# Patient Record
Sex: Male | Born: 1955 | Race: White | Hispanic: No | Marital: Married | Smoking: Current every day smoker
Health system: Southern US, Community
[De-identification: ages and names within clinical notes are randomized; demographics above are authoritative.]

## PROBLEM LIST (undated history)

## (undated) DIAGNOSIS — R1310 Dysphagia, unspecified: Secondary | ICD-10-CM

## (undated) DIAGNOSIS — D649 Anemia, unspecified: Secondary | ICD-10-CM

## (undated) DIAGNOSIS — Z932 Ileostomy status: Secondary | ICD-10-CM

## (undated) DIAGNOSIS — K56609 Unspecified intestinal obstruction, unspecified as to partial versus complete obstruction: Secondary | ICD-10-CM

## (undated) HISTORY — PX: APPENDECTOMY: SHX54

---

## 2002-08-17 ENCOUNTER — Ambulatory Visit (HOSPITAL_BASED_OUTPATIENT_CLINIC_OR_DEPARTMENT_OTHER): Admission: RE | Admit: 2002-08-17 | Discharge: 2002-08-17 | Payer: Self-pay | Admitting: Orthopedic Surgery

## 2009-07-31 ENCOUNTER — Ambulatory Visit: Payer: Self-pay | Admitting: Diagnostic Radiology

## 2009-07-31 ENCOUNTER — Emergency Department (HOSPITAL_BASED_OUTPATIENT_CLINIC_OR_DEPARTMENT_OTHER): Admission: EM | Admit: 2009-07-31 | Discharge: 2009-07-31 | Payer: Self-pay | Admitting: Emergency Medicine

## 2009-09-28 ENCOUNTER — Ambulatory Visit: Payer: Self-pay | Admitting: Diagnostic Radiology

## 2009-09-28 ENCOUNTER — Emergency Department (HOSPITAL_BASED_OUTPATIENT_CLINIC_OR_DEPARTMENT_OTHER): Admission: EM | Admit: 2009-09-28 | Discharge: 2009-09-28 | Payer: Self-pay | Admitting: Emergency Medicine

## 2010-06-29 ENCOUNTER — Emergency Department (INDEPENDENT_AMBULATORY_CARE_PROVIDER_SITE_OTHER): Payer: Self-pay

## 2010-06-29 ENCOUNTER — Emergency Department (HOSPITAL_BASED_OUTPATIENT_CLINIC_OR_DEPARTMENT_OTHER)
Admission: EM | Admit: 2010-06-29 | Discharge: 2010-06-29 | Disposition: A | Discharge status: Home / Self Care | Payer: Self-pay | Attending: Emergency Medicine | Admitting: Emergency Medicine

## 2010-06-29 DIAGNOSIS — R079 Chest pain, unspecified: Secondary | ICD-10-CM

## 2010-06-29 DIAGNOSIS — M25519 Pain in unspecified shoulder: Secondary | ICD-10-CM | POA: Insufficient documentation

## 2010-06-29 DIAGNOSIS — R109 Unspecified abdominal pain: Secondary | ICD-10-CM | POA: Insufficient documentation

## 2010-06-29 DIAGNOSIS — W11XXXA Fall on and from ladder, initial encounter: Secondary | ICD-10-CM | POA: Insufficient documentation

## 2010-06-29 DIAGNOSIS — S20229A Contusion of unspecified back wall of thorax, initial encounter: Secondary | ICD-10-CM | POA: Insufficient documentation

## 2010-07-08 LAB — CBC
HCT: 49.4 % (ref 39.0–52.0)
Hemoglobin: 16.3 g/dL (ref 13.0–17.0)
MCV: 87.6 fL (ref 78.0–100.0)
Platelets: 216 10*3/uL (ref 150–400)

## 2010-07-08 LAB — URINALYSIS, ROUTINE W REFLEX MICROSCOPIC
Protein, ur: NEGATIVE mg/dL
Specific Gravity, Urine: 1.01 (ref 1.005–1.030)

## 2010-07-08 LAB — BASIC METABOLIC PANEL
CO2: 26 mEq/L (ref 19–32)
Calcium: 9.9 mg/dL (ref 8.4–10.5)
Chloride: 106 mEq/L (ref 96–112)
Sodium: 142 mEq/L (ref 135–145)

## 2010-07-08 LAB — DIFFERENTIAL
Basophils Relative: 2 % — ABNORMAL HIGH (ref 0–1)
Eosinophils Absolute: 0.1 10*3/uL (ref 0.0–0.7)
Lymphocytes Relative: 29 % (ref 12–46)
Lymphs Abs: 2 10*3/uL (ref 0.7–4.0)
Monocytes Absolute: 0.5 10*3/uL (ref 0.1–1.0)
Monocytes Relative: 7 % (ref 3–12)
Neutrophils Relative %: 60 % (ref 43–77)

## 2010-09-04 NOTE — Op Note (Signed)
NAME:  Devon Campbell, Devon Campbell                           ACCOUNT NO.:  [id number]   MEDICAL RECORD NO.:  [id number]                   PATIENT TYPE:  AMB   LOCATION:  DSC                                  FACILITY:  [facility]   PHYSICIAN:  Harvie Junior, M.D.                DATE OF BIRTH:  04/12/1956   DATE OF PROCEDURE:  08/17/2002  DATE OF DISCHARGE:                                 OPERATIVE REPORT   PREOPERATIVE DIAGNOSIS:  Dislocated metacarpophalangeal joint, little finger  right hand, irreducible.   POSTOPERATIVE DIAGNOSIS:  Dislocated metacarpophalangeal joint, little  finger right hand, irreducible.   PROCEDURE:  Open reduction of right fifth metacarpal metacarpophalangeal  joint.   SURGEON:  Harvie Junior, M.D.   ASSISTANT:  Marshia Ly, P.A.   ANESTHESIA:  General.   BRIEF HISTORY:  He is a 55 year old male with a long history of having an  injury while working on a motorcycle.  He ultimately was evaluated initially  at urgent care and then ultimately by Otho Darner, M.D., at [facility], where he was noted to have a metacarpophalangeal joint  dislocation.  Ultimately multiple attempts at relocation were unsuccessful.  At that point we were consulted for management.  At that point we recognized  that he had an irreducible metacarpophalangeal joint and felt that open  reduction and internal fixation was the only appropriate course of action an  he was brought to the operating room for this procedure.   DESCRIPTION OF PROCEDURE:  The patient was brought to the operating room and  after adequate anesthesia was obtained with a general anesthetic, the  patient was placed on the operating table.  The right arm was prepped and  draped in the usual sterile fashion.  Following this an attempt at closed  reduction was again undertaken and was unsuccessful.  At this point  a  linear incision was made dorsally, subcutaneous tissue taken down to the  level of the  extensor mechanism, which was retracted to the side.  The  extensor hood was identified and divided.  The joint capsule was identified  and divided.  At this point the reason that the metacarpal would not reduce  was clearly identified, as it had buttonholed through the volar restraints.  The volar plate, which was trapped in the joint, was identified and at this  point we were able to use a Therapist, nutritional and actually lever the metacarpal  head around this irreducible situation so we did not have to cut it, which I  think it was good because it would not then allowed him to have a volar  tendency toward subluxation.  At this point, the metacarpophalangeal joint  reduced, was irrigated.  The joint was irrigated.  There was no evidence of  fracture or damage to the tissue, although there were some small flakes of  cartilage that were removed from  the joint at this point.  At this point the  extensor mechanism, the periosteum over the metacarpal was reduced and  repaired with a 4-0 Vicryl interrupted suture. The extensor hood was then  repaired with a 4-0 Vicryl interrupted suture.  The skin was then closed  with a 4-0 nylon.  Multiple fluoroscopic images were used throughout the  case to make sure that the joint did stay reduced.  There was no  tendency toward re-dislocation.  At this point the patient was placed into  an ulnar gutter splint after the skin was closed with 4-0 nylon and a  sterile compressive dressing had been applied.  The patient was taken to the  operating room, where he was noted to be in satisfactory condition.  Estimated blood loss for the procedure was none.                                               Harvie Junior, M.D.    Ranae Plumber  D:  08/17/2002  T:  08/17/2002  Job:  [id number]

## 2013-11-13 ENCOUNTER — Encounter (HOSPITAL_BASED_OUTPATIENT_CLINIC_OR_DEPARTMENT_OTHER): Payer: Self-pay | Admitting: Emergency Medicine

## 2013-11-13 ENCOUNTER — Emergency Department (HOSPITAL_BASED_OUTPATIENT_CLINIC_OR_DEPARTMENT_OTHER)
Admission: EM | Admit: 2013-11-13 | Discharge: 2013-11-13 | Disposition: A | Discharge status: Home / Self Care | Payer: Self-pay | Attending: Emergency Medicine | Admitting: Emergency Medicine

## 2013-11-13 DIAGNOSIS — W268XXA Contact with other sharp object(s), not elsewhere classified, initial encounter: Secondary | ICD-10-CM | POA: Insufficient documentation

## 2013-11-13 DIAGNOSIS — Y929 Unspecified place or not applicable: Secondary | ICD-10-CM | POA: Insufficient documentation

## 2013-11-13 DIAGNOSIS — S51809A Unspecified open wound of unspecified forearm, initial encounter: Secondary | ICD-10-CM | POA: Insufficient documentation

## 2013-11-13 DIAGNOSIS — F17200 Nicotine dependence, unspecified, uncomplicated: Secondary | ICD-10-CM | POA: Insufficient documentation

## 2013-11-13 DIAGNOSIS — Y939 Activity, unspecified: Secondary | ICD-10-CM | POA: Insufficient documentation

## 2013-11-13 DIAGNOSIS — S51811A Laceration without foreign body of right forearm, initial encounter: Secondary | ICD-10-CM

## 2013-11-13 MED ORDER — HYDROCODONE-ACETAMINOPHEN 5-325 MG PO TABS: 2.0000 | ORAL_TABLET | ORAL | Status: DC | PRN

## 2013-11-13 NOTE — ED Notes (Signed)
Laceration to right forearm 5 cm. Bleeding controlled. Partial thickness

## 2013-11-13 NOTE — Discharge Instructions (Signed)
Sutured Wound Care °Sutures are stitches that can be used to close wounds. Wound care helps prevent pain and infection.  °HOME CARE INSTRUCTIONS  °· Rest and elevate the injured area until all the pain and swelling are gone. °· Only take over-the-counter or prescription medicines for pain, discomfort, or fever as directed by your caregiver. °· After 48 hours, gently wash the area with mild soap and water once a day, or as directed. Rinse off the soap. Pat the area dry with a clean towel. Do not rub the wound. This may cause bleeding. °· Follow your caregiver's instructions for how often to change the bandage (dressing). Stop using a dressing after 2 days or after the wound stops draining. °· If the dressing sticks, moisten it with soapy water and gently remove it. °· Apply ointment on the wound as directed. °· Avoid stretching a sutured wound. °· Drink enough fluids to keep your urine clear or pale yellow. °· Follow up with your caregiver for suture removal as directed. °· Use sunscreen on your wound for the next 3 to 6 months so the scar will not darken. °SEEK IMMEDIATE MEDICAL CARE IF:  °· Your wound becomes red, swollen, hot, or tender. °· You have increasing pain in the wound. °· You have a red streak that extends from the wound. °· There is pus coming from the wound. °· You have a fever. °· You have shaking chills. °· There is a bad smell coming from the wound. °· You have persistent bleeding from the wound. °MAKE SURE YOU:  °· Understand these instructions. °· Will watch your condition. °· Will get help right away if you are not doing well or get worse. °Document Released: 05/13/2004 Document Revised: 06/28/2011 Document Reviewed: 08/09/2010 °ExitCare® Patient Information ©2015 [employer]. This information is not intended to replace advice given to you by your health care provider. Make sure you discuss any questions you have with your health care provider. ° °

## 2013-11-13 NOTE — ED Provider Notes (Signed)
CSN: [id number]     Arrival date & time 11/13/13  1349 History   First MD Initiated Contact with Patient 11/13/13 1433     Chief Complaint  Patient presents with  . Extremity Laceration     (Consider location/radiation/quality/duration/timing/severity/associated sxs/prior Treatment) Patient is a 58 y.o. male presenting with skin laceration. The history is provided by the patient. No language interpreter was used.  Laceration Location:  Shoulder/arm Shoulder/arm laceration location:  R forearm Length (cm):  3 Depth:  Cutaneous Time since incident:  1 hour Laceration mechanism:  Metal edge Pain details:    Quality:  Aching   Severity:  No pain Foreign body present:  No foreign bodies Relieved by:  Nothing Worsened by:  Nothing tried Tetanus status:  Up to date   History reviewed. No pertinent past medical history. Past Surgical History  Procedure Laterality Date  . Appendectomy     No family history on file. History  Substance Use Topics  . Smoking status: Current Every Day Smoker  . Smokeless tobacco: Not on file  . Alcohol Use: Yes    Review of Systems  Skin: Positive for wound.  All other systems reviewed and are negative.     Allergies  Review of patient's allergies indicates no known allergies.  Home Medications   Prior to Admission medications   Not on File   BP 129/83  Pulse 98  Temp(Src) 97.4 F (36.3 C) (Oral)  Resp 18  Ht 6\' 2"  (1.88 m)  Wt 230 lb (104.327 kg)  BMI 29.52 kg/m2  SpO2 97% Physical Exam  Constitutional: He appears well-developed and well-nourished.  HENT:  Head: Normocephalic and atraumatic.  Eyes: Pupils are equal, round, and reactive to light.  Cardiovascular: Normal rate.   Pulmonary/Chest: Effort normal.  Musculoskeletal: He exhibits tenderness.  3 cm laceration right forearm  Neurological: He is alert.  Skin: Skin is warm.  Psychiatric: He has a normal mood and affect.    ED Course  LACERATION REPAIR Date/Time:  11/13/2013 3:40 PM Performed by: Elson AreasSOFIA, LESLIE K Authorized by: Elson AreasSOFIA, LESLIE K Consent: Verbal consent obtained. Risks and benefits: risks, benefits and alternatives were discussed Consent given by: patient Patient understanding: patient states understanding of the procedure being performed Patient identity confirmed: verbally with patient Laceration length: 3 cm Foreign bodies: no foreign bodies Tendon involvement: none Nerve involvement: none Anesthesia: local infiltration Local anesthetic: lidocaine 1% without epinephrine Patient sedated: no Preparation: Patient was prepped and draped in the usual sterile fashion. Skin closure: 5-0 Prolene Number of sutures: 5 Technique: simple Approximation: loose Approximation difficulty: simple Patient tolerance: Patient tolerated the procedure well with no immediate complications.   (including critical care time) Labs Review Labs Reviewed - No data to display  Imaging Review No results found.   EKG Interpretation None      MDM   Final diagnoses:  Laceration of forearm, right, initial encounter    Hydrocodone for pain Suture removal in 8 days    Elson AreasLeslie K Sofia, PA-C 11/13/13 1541

## 2013-11-15 NOTE — ED Provider Notes (Signed)
Medical screening examination/treatment/procedure(s) were performed by non-physician practitioner and as supervising physician I was immediately available for consultation/collaboration.   EKG Interpretation None       Martha K Linker, MD 11/15/13 0653 

## 2013-11-24 ENCOUNTER — Encounter (HOSPITAL_BASED_OUTPATIENT_CLINIC_OR_DEPARTMENT_OTHER): Payer: Self-pay | Admitting: Emergency Medicine

## 2013-11-24 ENCOUNTER — Emergency Department (HOSPITAL_BASED_OUTPATIENT_CLINIC_OR_DEPARTMENT_OTHER)
Admission: EM | Admit: 2013-11-24 | Discharge: 2013-11-24 | Disposition: A | Discharge status: Home / Self Care | Payer: Self-pay | Attending: Emergency Medicine | Admitting: Emergency Medicine

## 2013-11-24 DIAGNOSIS — R509 Fever, unspecified: Secondary | ICD-10-CM | POA: Insufficient documentation

## 2013-11-24 DIAGNOSIS — T8140XA Infection following a procedure, unspecified, initial encounter: Secondary | ICD-10-CM | POA: Insufficient documentation

## 2013-11-24 DIAGNOSIS — T798XXA Other early complications of trauma, initial encounter: Secondary | ICD-10-CM

## 2013-11-24 DIAGNOSIS — S41111S Laceration without foreign body of right upper arm, sequela: Secondary | ICD-10-CM

## 2013-11-24 DIAGNOSIS — Z4801 Encounter for change or removal of surgical wound dressing: Secondary | ICD-10-CM | POA: Insufficient documentation

## 2013-11-24 DIAGNOSIS — F17200 Nicotine dependence, unspecified, uncomplicated: Secondary | ICD-10-CM | POA: Insufficient documentation

## 2013-11-24 DIAGNOSIS — Y838 Other surgical procedures as the cause of abnormal reaction of the patient, or of later complication, without mention of misadventure at the time of the procedure: Secondary | ICD-10-CM | POA: Insufficient documentation

## 2013-11-24 MED ORDER — OXYCODONE-ACETAMINOPHEN 5-325 MG PO TABS
1.0000 | ORAL_TABLET | Freq: Once | ORAL | Status: AC
  Administered 2013-11-24: 1 via ORAL
  Filled 2013-11-24: qty 1

## 2013-11-24 MED ORDER — CLINDAMYCIN HCL 150 MG PO CAPS: 450.0000 mg | ORAL_CAPSULE | Freq: Three times a day (TID) | ORAL | Status: DC

## 2013-11-24 MED ORDER — CLINDAMYCIN HCL 150 MG PO CAPS
450.0000 mg | ORAL_CAPSULE | Freq: Once | ORAL | Status: AC
  Administered 2013-11-24: 450 mg via ORAL
  Filled 2013-11-24: qty 3

## 2013-11-24 MED ORDER — OXYCODONE-ACETAMINOPHEN 5-325 MG PO TABS: 1.0000 | ORAL_TABLET | Freq: Four times a day (QID) | ORAL | Status: DC | PRN

## 2013-11-24 NOTE — Discharge Instructions (Signed)
Wound Infection °A wound infection happens when a type of germ (bacteria) starts growing in the wound. In some cases, this can cause the wound to break open. If cared for properly, the infected wound will heal from the inside to the outside. Wound infections need treatment. °CAUSES °An infection is caused by bacteria growing in the wound.  °SYMPTOMS  °· Increase in redness, swelling, or pain at the wound site. °· Increase in drainage at the wound site. °· Wound or bandage (dressing) starts to smell bad. °· Fever. °· Feeling tired or fatigued. °· Pus draining from the wound. °TREATMENT  °Your health care provider will prescribe antibiotic medicine. The wound infection should improve within 24 to 48 hours. Any redness around the wound should stop spreading and the wound should be less painful.  °HOME CARE INSTRUCTIONS  °· Only take over-the-counter or prescription medicines for pain, discomfort, or fever as directed by your health care provider. °· Take your antibiotics as directed. Finish them even if you start to feel better. °· Gently wash the area with mild soap and water 2 times a day, or as directed. Rinse off the soap. Pat the area dry with a clean towel. Do not rub the wound. This may cause bleeding. °· Follow your health care provider's instructions for how often you need to change the dressing. °· Apply ointment and a dressing to the wound as directed. °· If the dressing sticks, moisten it with soapy water and gently remove it. °· Change the bandage right away if it becomes wet, dirty, or develops a bad smell. °· Take showers. Do not take tub baths, swim, or do anything that may soak the wound until it is healed. °· Avoid exercises that make you sweat heavily. °· Use anti-itch medicine as directed by your health care provider. The wound may itch when it is healing. Do not pick or scratch at the wound. °· Follow up with your health care provider to get your wound rechecked as directed. °SEEK MEDICAL CARE  IF: °· You have an increase in swelling, pain, or redness around the wound. °· You have an increase in the amount of pus coming from the wound. °· There is a bad smell coming from the wound. °· More of the wound breaks open. °· You have a fever. °MAKE SURE YOU:  °· Understand these instructions. °· Will watch your condition. °· Will get help right away if you are not doing well or get worse. °Document Released: 01/02/2003 Document Revised: 04/10/2013 Document Reviewed: 08/09/2010 °ExitCare® Patient Information ©2015 [employer]. This information is not intended to replace advice given to you by your health care provider. Make sure you discuss any questions you have with your health care provider. ° °

## 2013-11-24 NOTE — ED Notes (Signed)
Patient had stitches placed last week on his right forearm, states he needs them out, but also feels the area may be infected. Area is warm and sensitive to touch.

## 2013-11-24 NOTE — ED Notes (Signed)
Stitches were removed and Bacitracin applied to wound and wrapped with gauze.

## 2013-11-24 NOTE — ED Provider Notes (Signed)
CSN: [id number]     Arrival date & time 11/24/13  1508 History  This chart was scribed for Toy Cookey, MD by Elon Spanner, ED Scribe. This patient was seen in room MH03/MH03 and the patient's care was started at 4:30 PM.    Chief Complaint  Patient presents with  . Wound Check   Patient is a 58 y.o. male presenting with wound check. The history is provided by the patient and the spouse. No language interpreter was used.  Wound Check This is a new problem. The current episode started more than 1 week ago. The problem has been gradually worsening (healing well.  minor drainage observed over last 3 days. ). Pertinent negatives include no chest pain, no abdominal pain, no headaches and no shortness of breath. Nothing aggravates the symptoms. Nothing relieves the symptoms.    HPI Comments: Devon Campbell is a 58 y.o. male who presents to the Emergency Department seeking suture removal for sutures placed on 7/27.  Patient states that the wound has been draining a greenish/yellow pus for 3 days and describes pain in the surrounding area.  Patients states that he had a TMAX of101.2 for the last 2 days.  Patient was given Bactrim to treat the sutured area and has been compliant.  Patient has also taken Tylenol today.     History reviewed. No pertinent past medical history. Past Surgical History  Procedure Laterality Date  . Appendectomy     No family history on file. History  Substance Use Topics  . Smoking status: Current Every Day Smoker  . Smokeless tobacco: Not on file  . Alcohol Use: Yes    Review of Systems  Constitutional: Positive for fever. Negative for activity change, appetite change and fatigue.  HENT: Negative for congestion, facial swelling, rhinorrhea and trouble swallowing.   Eyes: Negative for photophobia and pain.  Respiratory: Negative for cough, chest tightness and shortness of breath.   Cardiovascular: Negative for chest pain and leg swelling.  Gastrointestinal:  Negative for nausea, vomiting, abdominal pain, diarrhea and constipation.  Endocrine: Negative for polydipsia and polyuria.  Genitourinary: Negative for dysuria, urgency, decreased urine volume and difficulty urinating.  Musculoskeletal: Negative for back pain and gait problem.  Skin: Positive for wound. Negative for color change and rash.  Allergic/Immunologic: Negative for immunocompromised state.  Neurological: Negative for dizziness, facial asymmetry, speech difficulty, weakness, numbness and headaches.  Psychiatric/Behavioral: Negative for confusion, decreased concentration and agitation.      Allergies  Tramadol  Home Medications   Prior to Admission medications   Medication Sig Start Date End Date Taking? Authorizing Provider  clindamycin (CLEOCIN) 150 MG capsule Take 3 capsules (450 mg total) by mouth 3 (three) times daily. For 1 week 11/24/13   Toy Cookey, MD  HYDROcodone-acetaminophen (NORCO/VICODIN) 5-325 MG per tablet Take 2 tablets by mouth every 4 (four) hours as needed. 11/13/13   Elson Areas, PA-C  oxyCODONE-acetaminophen (ROXICET) 5-325 MG per tablet Take 1-2 tablets by mouth every 6 (six) hours as needed for severe pain. 11/24/13   Toy Cookey, MD   BP 140/84  Pulse 64  Temp(Src) 97.9 F (36.6 C) (Oral)  Resp 12  SpO2 99% Physical Exam  Nursing note and vitals reviewed. Constitutional: He is oriented to person, place, and time. He appears well-developed and well-nourished. No distress.  HENT:  Head: Normocephalic and atraumatic.  Mouth/Throat: No oropharyngeal exudate.  Eyes: Pupils are equal, round, and reactive to light.  Neck: Normal range of motion. Neck supple.  Cardiovascular: Normal rate, regular rhythm and normal heart sounds.  Exam reveals no gallop and no friction rub.   No murmur heard. Pulmonary/Chest: Effort normal and breath sounds normal. No respiratory distress. He has no wheezes. He has no rales.  Abdominal: Soft. Bowel sounds are  normal. He exhibits no distension and no mass. There is no tenderness. There is no rebound and no guarding.  Musculoskeletal: Normal range of motion. He exhibits no edema and no tenderness.  Neurological: He is alert and oriented to person, place, and time.  Skin: Skin is warm and dry.  4 cm laceration with surrounding erythema and purulent drainage on lateral edge of wound.  5 sutures removed.   Psychiatric: He has a normal mood and affect.    ED Course  Procedures (including critical care time)  DIAGNOSTIC STUDIES: Oxygen Saturation is 99% on RA, normal by my interpretation.    COORDINATION OF CARE:  4:35 PM Discussed plans to perform suture removal as well as suspicion of an infection around the sutured area.  Discussed plan to order pain medication and prescribe antibiotics.  Patient acknowledges and agrees with plan.    SUTURE REMOVAL Performed by: Toy CookeyMegan Docherty, MD Authorized by: Toy CookeyMegan Docherty, MD Consent: Verbal consent obtained. Consent given by: patient Required items: required blood products, implants, devices, and special equipment available  Time out: Immediately prior to procedure a "time out" was called to verify the correct patient, procedure, equipment, support staff and site/side marked as required. Location: 4 cm laceration on right forearm Wound Appearance: surrounding erythema with purulent drainage on lateral edge of wound Sutures Removed: 5 Post-removal: sterile dressing Patient tolerance: Patient tolerated the procedure well with no immediate complications.   Labs Review Labs Reviewed - No data to display  Imaging Review No results found.   EKG Interpretation None      MDM   Final diagnoses:  Laceration of arm, right, sequela  Wound infection, initial encounter   Pt is a 58 y.o. male with Pmhx as above who presents with worsening pain, redness, purulent drainage of wound repaired about 10 days ago, as well as intermittent fevers. On PE, VSS,  afebrile, in NAD. Lateral wound edge has purulent drainage under unroofed scab.  5 sutures removed. He has about 1cm surrounding erythema. Will start on PO clinda for wound infection. Return precautions given for new or worsening symptoms including worsening pain, continued fevers, worsening redness, development of streaking or drainage.      EKG Interpretation None        I personally performed the services described in this documentation, which was scribed in my presence. The recorded information has been reviewed and is accurate.     Toy CookeyMegan Docherty, MD 11/25/13 0005

## 2014-03-14 ENCOUNTER — Emergency Department (HOSPITAL_BASED_OUTPATIENT_CLINIC_OR_DEPARTMENT_OTHER)
Admission: EM | Admit: 2014-03-14 | Discharge: 2014-03-14 | Discharge status: Against Medical Advice | Payer: Self-pay | Attending: Emergency Medicine | Admitting: Emergency Medicine

## 2014-03-14 ENCOUNTER — Encounter (HOSPITAL_BASED_OUTPATIENT_CLINIC_OR_DEPARTMENT_OTHER): Payer: Self-pay

## 2014-03-14 DIAGNOSIS — R55 Syncope and collapse: Secondary | ICD-10-CM | POA: Insufficient documentation

## 2014-03-14 DIAGNOSIS — Z720 Tobacco use: Secondary | ICD-10-CM | POA: Insufficient documentation

## 2014-03-14 DIAGNOSIS — R51 Headache: Secondary | ICD-10-CM | POA: Insufficient documentation

## 2014-03-14 MED ORDER — SODIUM CHLORIDE 0.9 % IV BOLUS (SEPSIS): 1000.0000 mL | Freq: Once | INTRAVENOUS | Status: DC

## 2014-03-14 NOTE — ED Notes (Signed)
Entered patient's room to complete tasks, patient's wife is addiment about them leaving, EMT attempted service recovery and called charge RN.

## 2014-03-14 NOTE — ED Provider Notes (Signed)
CSN: [id number]     Arrival date & time 03/14/14  1614 History   First MD Initiated Contact with Patient 03/14/14 1644     Chief Complaint  Patient presents with  . Weakness     (Consider location/radiation/quality/duration/timing/severity/associated sxs/prior Treatment) HPI Comments: Patient brought in by EMS with near syncopal episode. 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 He did this without a problem had no chest pain or shortness of breath. Upon reentering his car he developed a feeling of clamminess and lightheadedness. He never had any chest pain or shortness of breath. No focal weakness, numbness or tingling. Symptoms lasted about 30-40 minutes. On EMS arrival his blood sugar was 125. He denies any diabetes or any other medical history. No heart or lung problems. No medications. He is a smoker. He endorses good by mouth intake over the past several days. He has not skipped any meals. No history of heart or lung problems. Symptoms resolved after arrival in the ED. Feels back to baseline now.  The history is provided by the patient, the EMS personnel and a relative.    History reviewed. No pertinent past medical history. Past Surgical History  Procedure Laterality Date  . Appendectomy     No family history on file. History  Substance Use Topics  . Smoking status: Current Every Day Smoker  . Smokeless tobacco: Not on file  . Alcohol Use: Yes    Review of Systems  Constitutional: Negative for fever, activity change and appetite change.  Respiratory: Negative for cough, shortness of breath and stridor.   Cardiovascular: Negative for chest pain.  Gastrointestinal: Negative for nausea, vomiting and abdominal pain.  Genitourinary: Negative for dysuria and hematuria.  Musculoskeletal: Negative for myalgias and arthralgias.  Skin: Negative for wound.  Neurological: Positive for weakness, light-headedness and headaches. Negative for  dizziness.  A complete 10 system review of systems was obtained and all systems are negative except as noted in the HPI and PMH.      Allergies  Tramadol  Home Medications   Prior to Admission medications   Not on File   BP 134/81 mmHg  Pulse 57  Temp(Src) 98.2 F (36.8 C) (Oral)  Resp 18  SpO2 98% Physical Exam  Constitutional: He is oriented to person, place, and time. He appears well-developed and well-nourished. No distress.  HENT:  Head: Normocephalic and atraumatic.  Mouth/Throat: Oropharynx is clear and moist. No oropharyngeal exudate.  Eyes: Conjunctivae and EOM are normal. Pupils are equal, round, and reactive to light.  Neck: Normal range of motion. Neck supple.  No meningismus.  Cardiovascular: Normal rate, regular rhythm, normal heart sounds and intact distal pulses.   No murmur heard. Pulmonary/Chest: Effort normal and breath sounds normal. No respiratory distress.  Abdominal: Soft. There is no tenderness. There is no rebound and no guarding.  Musculoskeletal: Normal range of motion. He exhibits no edema or tenderness.  Neurological: He is alert and oriented to person, place, and time. No cranial nerve deficit. He exhibits normal muscle tone. Coordination normal.  No ataxia on finger to nose bilaterally. No pronator drift. 5/5 strength throughout. CN 2-12 intact. Negative Romberg. Equal grip strength. Sensation intact. Gait is normal.   Skin: Skin is warm.  Psychiatric: He has a normal mood and affect. His behavior is normal.  Nursing note and vitals reviewed.   ED Course  Procedures (including critical care time) Labs Review Labs Reviewed - No  data to display  Imaging Review No results found.   EKG Interpretation   Date/Time:  Thursday March 14 2014 16:23:03 EST Ventricular Rate:  59 PR Interval:  152 QRS Duration: 84 QT Interval:  416 QTC Calculation: 411 R Axis:   27 Text Interpretation:  Sinus bradycardia Otherwise normal ECG No   significant change was found Confirmed by Manus GunningANCOUR  MD, Shylie Polo [phone]([id number]) on  03/14/2014 4:42:10 PM      MDM   Final diagnoses:  Near syncope   Syncopal episode with clamminess and lightheadedness. Now resolved. No chest pain or shortness of breath. EKG sinus bradycardia.  Planned to obtain orthostatics, baseline labs, give IV fluids.  Patient refusing further evaluation stating he feels fine and wants to leave. He understands he could be at risk for a serious or life-threatening event including arrhythmia or heart attack. He could have anemia which may lead to syncope and serious injury. He understands these risks and appears to have capacity to make medical decisions and will leave AGAINST MEDICAL ADVICE.   Glynn OctaveStephen Kevaughn Ewing, MD 03/15/14 [phone]

## 2014-03-14 NOTE — ED Notes (Signed)
MD at bedside. 

## 2014-03-14 NOTE — ED Notes (Signed)
Patient arrived by EMS with complaint of "feeling out of sorts". 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 Denies chestpain, no other complaints other than slight headache

## 2014-03-14 NOTE — ED Notes (Signed)
Patient decided he did not want labs collected, wants to be signed out. Rancour back to room to discuss risks of leaving, patient understands risk but signed out AMA

## 2020-08-26 ENCOUNTER — Other Ambulatory Visit: Payer: Self-pay

## 2020-08-26 ENCOUNTER — Inpatient Hospital Stay (HOSPITAL_COMMUNITY): Payer: Medicare Other

## 2020-08-26 ENCOUNTER — Emergency Department (HOSPITAL_COMMUNITY): Payer: Medicare Other

## 2020-08-26 ENCOUNTER — Encounter (HOSPITAL_COMMUNITY): Payer: Self-pay | Admitting: Emergency Medicine

## 2020-08-26 ENCOUNTER — Inpatient Hospital Stay (HOSPITAL_COMMUNITY)
Admission: EM | Admit: 2020-08-26 | Discharge: 2020-09-17 | DRG: 871 | Disposition: E | Payer: Medicare Other | Source: Skilled Nursing Facility | Attending: Pulmonary Disease | Admitting: Pulmonary Disease

## 2020-08-26 DIAGNOSIS — Z9049 Acquired absence of other specified parts of digestive tract: Secondary | ICD-10-CM

## 2020-08-26 DIAGNOSIS — E43 Unspecified severe protein-calorie malnutrition: Secondary | ICD-10-CM | POA: Diagnosis present

## 2020-08-26 DIAGNOSIS — F17200 Nicotine dependence, unspecified, uncomplicated: Secondary | ICD-10-CM | POA: Diagnosis present

## 2020-08-26 DIAGNOSIS — D631 Anemia in chronic kidney disease: Secondary | ICD-10-CM | POA: Diagnosis present

## 2020-08-26 DIAGNOSIS — Z515 Encounter for palliative care: Secondary | ICD-10-CM

## 2020-08-26 DIAGNOSIS — L89312 Pressure ulcer of right buttock, stage 2: Secondary | ICD-10-CM | POA: Diagnosis present

## 2020-08-26 DIAGNOSIS — D509 Iron deficiency anemia, unspecified: Secondary | ICD-10-CM | POA: Diagnosis present

## 2020-08-26 DIAGNOSIS — N189 Chronic kidney disease, unspecified: Secondary | ICD-10-CM | POA: Diagnosis not present

## 2020-08-26 DIAGNOSIS — Y838 Other surgical procedures as the cause of abnormal reaction of the patient, or of later complication, without mention of misadventure at the time of the procedure: Secondary | ICD-10-CM | POA: Diagnosis present

## 2020-08-26 DIAGNOSIS — Z8616 Personal history of COVID-19: Secondary | ICD-10-CM | POA: Diagnosis not present

## 2020-08-26 DIAGNOSIS — Z932 Ileostomy status: Secondary | ICD-10-CM

## 2020-08-26 DIAGNOSIS — Z79899 Other long term (current) drug therapy: Secondary | ICD-10-CM

## 2020-08-26 DIAGNOSIS — K632 Fistula of intestine: Secondary | ICD-10-CM | POA: Diagnosis present

## 2020-08-26 DIAGNOSIS — T8131XA Disruption of external operation (surgical) wound, not elsewhere classified, initial encounter: Secondary | ICD-10-CM | POA: Diagnosis present

## 2020-08-26 DIAGNOSIS — G9340 Encephalopathy, unspecified: Secondary | ICD-10-CM

## 2020-08-26 DIAGNOSIS — Z8546 Personal history of malignant neoplasm of prostate: Secondary | ICD-10-CM | POA: Diagnosis not present

## 2020-08-26 DIAGNOSIS — Z9981 Dependence on supplemental oxygen: Secondary | ICD-10-CM | POA: Diagnosis not present

## 2020-08-26 DIAGNOSIS — E872 Acidosis: Secondary | ICD-10-CM | POA: Diagnosis present

## 2020-08-26 DIAGNOSIS — Z888 Allergy status to other drugs, medicaments and biological substances status: Secondary | ICD-10-CM | POA: Diagnosis not present

## 2020-08-26 DIAGNOSIS — Z20822 Contact with and (suspected) exposure to covid-19: Secondary | ICD-10-CM | POA: Diagnosis present

## 2020-08-26 DIAGNOSIS — E119 Type 2 diabetes mellitus without complications: Secondary | ICD-10-CM | POA: Diagnosis present

## 2020-08-26 DIAGNOSIS — L89322 Pressure ulcer of left buttock, stage 2: Secondary | ICD-10-CM | POA: Diagnosis present

## 2020-08-26 DIAGNOSIS — R1310 Dysphagia, unspecified: Secondary | ICD-10-CM | POA: Diagnosis present

## 2020-08-26 DIAGNOSIS — K56609 Unspecified intestinal obstruction, unspecified as to partial versus complete obstruction: Secondary | ICD-10-CM | POA: Diagnosis not present

## 2020-08-26 DIAGNOSIS — J9601 Acute respiratory failure with hypoxia: Secondary | ICD-10-CM | POA: Diagnosis present

## 2020-08-26 DIAGNOSIS — Z923 Personal history of irradiation: Secondary | ICD-10-CM

## 2020-08-26 DIAGNOSIS — Z66 Do not resuscitate: Secondary | ICD-10-CM | POA: Diagnosis not present

## 2020-08-26 DIAGNOSIS — R6521 Severe sepsis with septic shock: Secondary | ICD-10-CM

## 2020-08-26 DIAGNOSIS — A419 Sepsis, unspecified organism: Principal | ICD-10-CM

## 2020-08-26 DIAGNOSIS — G9341 Metabolic encephalopathy: Secondary | ICD-10-CM | POA: Diagnosis present

## 2020-08-26 DIAGNOSIS — Z6820 Body mass index (BMI) 20.0-20.9, adult: Secondary | ICD-10-CM

## 2020-08-26 HISTORY — DX: Unspecified intestinal obstruction, unspecified as to partial versus complete obstruction: K56.609

## 2020-08-26 HISTORY — DX: Anemia, unspecified: D64.9

## 2020-08-26 HISTORY — DX: Ileostomy status: Z93.2

## 2020-08-26 HISTORY — DX: Dysphagia, unspecified: R13.10

## 2020-08-26 LAB — CBC WITH DIFFERENTIAL/PLATELET
Abs Immature Granulocytes: 0.85 10*3/uL — ABNORMAL HIGH (ref 0.00–0.07)
Basophils Absolute: 0.2 10*3/uL — ABNORMAL HIGH (ref 0.0–0.1)
Basophils Relative: 1 %
Eosinophils Absolute: 0.1 10*3/uL (ref 0.0–0.5)
Eosinophils Relative: 1 %
HCT: 44.5 % (ref 39.0–52.0)
Hemoglobin: 13 g/dL (ref 13.0–17.0)
Immature Granulocytes: 4 %
Lymphocytes Relative: 6 %
Lymphs Abs: 1.4 10*3/uL (ref 0.7–4.0)
MCH: 26.5 pg (ref 26.0–34.0)
MCHC: 29.2 g/dL — ABNORMAL LOW (ref 30.0–36.0)
MCV: 90.6 fL (ref 80.0–100.0)
Monocytes Absolute: 0.7 10*3/uL (ref 0.1–1.0)
Monocytes Relative: 3 %
Neutro Abs: 20.3 10*3/uL — ABNORMAL HIGH (ref 1.7–7.7)
Neutrophils Relative %: 85 %
Platelets: 509 10*3/uL — ABNORMAL HIGH (ref 150–400)
RBC: 4.91 MIL/uL (ref 4.22–5.81)
RDW: 17.5 % — ABNORMAL HIGH (ref 11.5–15.5)
WBC: 23.5 10*3/uL — ABNORMAL HIGH (ref 4.0–10.5)
nRBC: 0.1 % (ref 0.0–0.2)

## 2020-08-26 LAB — BLOOD GAS, ARTERIAL
Acid-base deficit: 3.3 mmol/L — ABNORMAL HIGH (ref 0.0–2.0)
Acid-base deficit: 6.2 mmol/L — ABNORMAL HIGH (ref 0.0–2.0)
Bicarbonate: 20.7 mmol/L (ref 20.0–28.0)
Bicarbonate: 18.3 mmol/L — ABNORMAL LOW (ref 20.0–28.0)
FIO2: 100
FIO2: 60
O2 Saturation: 98.6 %
O2 Saturation: 93.2 %
Patient temperature: 37
Patient temperature: 36.1
pCO2 arterial: 58 mmHg — ABNORMAL HIGH (ref 32.0–48.0)
pCO2 arterial: 58.6 mmHg — ABNORMAL HIGH (ref 32.0–48.0)
pH, Arterial: 7.226 — ABNORMAL LOW (ref 7.350–7.450)
pH, Arterial: 7.176 — CL (ref 7.350–7.450)
pO2, Arterial: 136 mmHg — ABNORMAL HIGH (ref 83.0–108.0)
pO2, Arterial: 76.5 mmHg — ABNORMAL LOW (ref 83.0–108.0)

## 2020-08-26 LAB — POCT I-STAT 7, (LYTES, BLD GAS, ICA,H+H)
Acid-base deficit: 12 mmol/L — ABNORMAL HIGH (ref 0.0–2.0)
Bicarbonate: 18.3 mmol/L — ABNORMAL LOW (ref 20.0–28.0)
Calcium, Ion: 1.39 mmol/L (ref 1.15–1.40)
HCT: 37 % — ABNORMAL LOW (ref 39.0–52.0)
Hemoglobin: 12.6 g/dL — ABNORMAL LOW (ref 13.0–17.0)
O2 Saturation: 85 %
Patient temperature: 96.1
Potassium: 3.6 mmol/L (ref 3.5–5.1)
Sodium: 136 mmol/L (ref 135–145)
TCO2: 20 mmol/L — ABNORMAL LOW (ref 22–32)
pCO2 arterial: 59.6 mmHg — ABNORMAL HIGH (ref 32.0–48.0)
pH, Arterial: 7.086 — CL (ref 7.350–7.450)
pO2, Arterial: 65 mmHg — ABNORMAL LOW (ref 83.0–108.0)

## 2020-08-26 LAB — COMPREHENSIVE METABOLIC PANEL
ALT: 17 U/L (ref 0–44)
AST: 23 U/L (ref 15–41)
Albumin: 2.4 g/dL — ABNORMAL LOW (ref 3.5–5.0)
Alkaline Phosphatase: 186 U/L — ABNORMAL HIGH (ref 38–126)
Anion gap: 13 (ref 5–15)
BUN: 80 mg/dL — ABNORMAL HIGH (ref 8–23)
CO2: 24 mmol/L (ref 22–32)
Calcium: 10.4 mg/dL — ABNORMAL HIGH (ref 8.9–10.3)
Chloride: 98 mmol/L (ref 98–111)
Creatinine, Ser: 2.4 mg/dL — ABNORMAL HIGH (ref 0.61–1.24)
GFR, Estimated: 29 mL/min — ABNORMAL LOW (ref >60)
Glucose, Bld: 138 mg/dL — ABNORMAL HIGH (ref 70–99)
Potassium: 3.7 mmol/L (ref 3.5–5.1)
Sodium: 135 mmol/L (ref 135–145)
Total Bilirubin: 0.6 mg/dL (ref 0.3–1.2)
Total Protein: 8.3 g/dL — ABNORMAL HIGH (ref 6.5–8.1)

## 2020-08-26 LAB — RESP PANEL BY RT-PCR (FLU A&B, COVID) ARPGX2
Influenza A by PCR: NEGATIVE
Influenza B by PCR: NEGATIVE
SARS Coronavirus 2 by RT PCR: NEGATIVE

## 2020-08-26 LAB — PROTIME-INR
INR: 1.2 (ref 0.8–1.2)
Prothrombin Time: 15.4 seconds — ABNORMAL HIGH (ref 11.4–15.2)

## 2020-08-26 LAB — LACTIC ACID, PLASMA
Lactic Acid, Venous: 3.7 mmol/L (ref 0.5–1.9)
Lactic Acid, Venous: 4 mmol/L (ref 0.5–1.9)
Lactic Acid, Venous: 5 mmol/L (ref 0.5–1.9)

## 2020-08-26 LAB — APTT: aPTT: 29 seconds (ref 24–36)

## 2020-08-26 LAB — GLUCOSE, CAPILLARY: Glucose-Capillary: 173 mg/dL — ABNORMAL HIGH (ref 70–99)

## 2020-08-26 MED ORDER — SODIUM CHLORIDE 0.9 % IV SOLN: 250.0000 mL | INTRAVENOUS | Status: DC

## 2020-08-26 MED ORDER — SODIUM BICARBONATE 8.4 % IV SOLN
INTRAVENOUS | Status: AC
  Administered 2020-08-26: 50 meq via INTRAVENOUS
  Filled 2020-08-26: qty 50

## 2020-08-26 MED ORDER — MIDAZOLAM HCL 2 MG/2ML IJ SOLN: 1.0000 mg | INTRAMUSCULAR | Status: DC | PRN

## 2020-08-26 MED ORDER — NOREPINEPHRINE 4 MG/250ML-% IV SOLN
2.0000 ug/min | INTRAVENOUS | Status: DC
  Administered 2020-08-26: 2 ug/min via INTRAVENOUS

## 2020-08-26 MED ORDER — FENTANYL CITRATE (PF) 100 MCG/2ML IJ SOLN: 50.0000 ug | Freq: Once | INTRAMUSCULAR | Status: DC

## 2020-08-26 MED ORDER — ALBUMIN HUMAN 5 % IV SOLN: 25.0000 g | Freq: Once | INTRAVENOUS | Status: AC

## 2020-08-26 MED ORDER — LACTATED RINGERS IV SOLN: INTRAVENOUS | Status: DC

## 2020-08-26 MED ORDER — VASOPRESSIN 20 UNITS/100 ML INFUSION FOR SHOCK
0.0300 [IU]/min | INTRAVENOUS | Status: DC
  Administered 2020-08-26: 0.03 [IU]/min via INTRAVENOUS
  Filled 2020-08-26 (×3): qty 100

## 2020-08-26 MED ORDER — VANCOMYCIN HCL IN DEXTROSE 1-5 GM/200ML-% IV SOLN: 1000.0000 mg | INTRAVENOUS | Status: DC

## 2020-08-26 MED ORDER — NOREPINEPHRINE 16 MG/250ML-% IV SOLN
5.0000 ug/min | INTRAVENOUS | Status: DC
  Filled 2020-08-26: qty 250

## 2020-08-26 MED ORDER — FENTANYL 2500MCG IN NS 250ML (10MCG/ML) PREMIX INFUSION: 25.0000 ug/h | INTRAVENOUS | Status: DC

## 2020-08-26 MED ORDER — POLYETHYLENE GLYCOL 3350 17 G PO PACK
17.0000 g | PACK | Freq: Every day | ORAL | Status: DC | PRN
Start: 2020-08-26 — End: 2020-08-27

## 2020-08-26 MED ORDER — VANCOMYCIN HCL 1500 MG/300ML IV SOLN
1500.0000 mg | Freq: Once | INTRAVENOUS | Status: AC
  Administered 2020-08-26: 1500 mg via INTRAVENOUS
  Filled 2020-08-26: qty 300

## 2020-08-26 MED ORDER — NOREPINEPHRINE 4 MG/250ML-% IV SOLN
INTRAVENOUS | Status: AC
  Filled 2020-08-26: qty 250

## 2020-08-26 MED ORDER — NOREPINEPHRINE 4 MG/250ML-% IV SOLN
5.0000 ug/min | INTRAVENOUS | Status: DC
  Administered 2020-08-26: 27 ug/min via INTRAVENOUS
  Filled 2020-08-26 (×2): qty 250

## 2020-08-26 MED ORDER — SODIUM CHLORIDE 0.9 % IV SOLN
2.0000 g | Freq: Once | INTRAVENOUS | Status: AC
  Administered 2020-08-26: 2 g via INTRAVENOUS
  Filled 2020-08-26: qty 2

## 2020-08-26 MED ORDER — CHLORHEXIDINE GLUCONATE 0.12% ORAL RINSE (MEDLINE KIT)
15.0000 mL | Freq: Two times a day (BID) | OROMUCOSAL | Status: DC
  Administered 2020-08-26: 15 mL via OROMUCOSAL

## 2020-08-26 MED ORDER — PANTOPRAZOLE SODIUM 40 MG IV SOLR: 40.0000 mg | Freq: Every day | INTRAVENOUS | Status: DC

## 2020-08-26 MED ORDER — LACTATED RINGERS IV BOLUS
1000.0000 mL | Freq: Once | INTRAVENOUS | Status: AC
  Administered 2020-08-26: 1000 mL via INTRAVENOUS

## 2020-08-26 MED ORDER — ORAL CARE MOUTH RINSE: 15.0000 mL | OROMUCOSAL | Status: DC

## 2020-08-26 MED ORDER — VANCOMYCIN HCL IN DEXTROSE 1-5 GM/200ML-% IV SOLN: 1000.0000 mg | Freq: Once | INTRAVENOUS | Status: DC

## 2020-08-26 MED ORDER — HEPARIN SODIUM (PORCINE) 5000 UNIT/ML IJ SOLN: 5000.0000 [IU] | Freq: Three times a day (TID) | INTRAMUSCULAR | Status: DC

## 2020-08-26 MED ORDER — ETOMIDATE 2 MG/ML IV SOLN
INTRAVENOUS | Status: AC | PRN
  Administered 2020-08-26: 20 mg via INTRAVENOUS

## 2020-08-26 MED ORDER — SODIUM CHLORIDE 0.9 % IV SOLN
500.0000 mg | Freq: Once | INTRAVENOUS | Status: AC
  Administered 2020-08-26: 500 mg via INTRAVENOUS
  Filled 2020-08-26: qty 500

## 2020-08-26 MED ORDER — ALBUMIN HUMAN 5 % IV SOLN
INTRAVENOUS | Status: AC
  Administered 2020-08-26: 12.5 g via INTRAVENOUS
  Filled 2020-08-26: qty 250

## 2020-08-26 MED ORDER — CHLORHEXIDINE GLUCONATE CLOTH 2 % EX PADS
6.0000 | MEDICATED_PAD | Freq: Every day | CUTANEOUS | Status: DC
  Administered 2020-08-26: 6 via TOPICAL

## 2020-08-26 MED ORDER — DOCUSATE SODIUM 50 MG/5ML PO LIQD: 100.0000 mg | Freq: Two times a day (BID) | ORAL | Status: DC | PRN

## 2020-08-26 MED ORDER — POLYETHYLENE GLYCOL 3350 17 G PO PACK: 17.0000 g | PACK | Freq: Every day | ORAL | Status: DC | PRN

## 2020-08-26 MED ORDER — ALBUMIN HUMAN 5 % IV SOLN: 12.5000 g | Freq: Once | INTRAVENOUS | Status: AC

## 2020-08-26 MED ORDER — SODIUM CHLORIDE 0.9 % IV SOLN: INTRAVENOUS | Status: DC | PRN

## 2020-08-26 MED ORDER — DOCUSATE SODIUM 100 MG PO CAPS: 100.0000 mg | ORAL_CAPSULE | Freq: Two times a day (BID) | ORAL | Status: DC | PRN

## 2020-08-26 MED ORDER — SODIUM CHLORIDE 0.9 % IV BOLUS
1000.0000 mL | Freq: Once | INTRAVENOUS | Status: AC
  Administered 2020-08-26: 1000 mL via INTRAVENOUS

## 2020-08-26 MED ORDER — FERROUS SULFATE 300 (60 FE) MG/5ML PO SYRP
300.0000 mg | ORAL_SOLUTION | Freq: Every day | ORAL | Status: DC
  Filled 2020-08-26: qty 5

## 2020-08-26 MED ORDER — VANCOMYCIN HCL 2000 MG/400ML IV SOLN: 2000.0000 mg | Freq: Once | INTRAVENOUS | Status: DC

## 2020-08-26 MED ORDER — SODIUM CHLORIDE 0.9 % IV SOLN
2.0000 g | Freq: Two times a day (BID) | INTRAVENOUS | Status: DC
  Administered 2020-08-26: 2 g via INTRAVENOUS
  Filled 2020-08-26: qty 2

## 2020-08-26 MED ORDER — PHENYLEPHRINE 40 MCG/ML (10ML) SYRINGE FOR IV PUSH (FOR BLOOD PRESSURE SUPPORT)
PREFILLED_SYRINGE | INTRAVENOUS | Status: AC | PRN
  Administered 2020-08-26 (×3): 100 ug via INTRAVENOUS

## 2020-08-26 MED ORDER — FENTANYL BOLUS VIA INFUSION
25.0000 ug | INTRAVENOUS | Status: DC | PRN
  Filled 2020-08-26: qty 25

## 2020-08-26 MED ORDER — HYDROCORTISONE NA SUCCINATE PF 100 MG IJ SOLR: 100.0000 mg | Freq: Once | INTRAMUSCULAR | Status: DC

## 2020-08-26 MED ORDER — SODIUM BICARBONATE 8.4 % IV SOLN: 50.0000 meq | Freq: Once | INTRAVENOUS | Status: AC

## 2020-08-26 MED ORDER — LACTATED RINGERS IV BOLUS (SEPSIS)
2000.0000 mL | Freq: Once | INTRAVENOUS | Status: AC
  Administered 2020-08-26: 2000 mL via INTRAVENOUS

## 2020-08-26 MED ORDER — ROCURONIUM BROMIDE 50 MG/5ML IV SOLN
INTRAVENOUS | Status: AC | PRN
  Administered 2020-08-26: 100 mg via INTRAVENOUS

## 2020-08-26 MED ORDER — LACTATED RINGERS IV BOLUS
500.0000 mL | Freq: Once | INTRAVENOUS | Status: AC
  Administered 2020-08-26: 500 mL via INTRAVENOUS

## 2020-08-26 NOTE — Progress Notes (Addendum)
eLink Physician-Brief Progress Note Patient Name: FIRMIN BELISLE DOB: 03-Sep-1955 MRN: [id number]   Date of Service  Aug 30, 2020  HPI/Events of Note  Patient admitted with septic shock.  eICU Interventions  New Patient Evaluation. PCCM ground crew is in the room doing a bedside echocardiogram.        Migdalia Dk 2020-08-30, 10:57 PM

## 2020-08-26 NOTE — ED Notes (Signed)
Date and time results received: 08/24/2020 1932  Test: pH Critical Value: 7.176  Name of Provider Notified: Rhunette Croft, MD  Orders Received? Or Actions Taken?: acknowledged

## 2020-08-26 NOTE — Sepsis Progress Note (Signed)
Notified provider of need to order repeat lactic acid. ° °

## 2020-08-26 NOTE — ED Notes (Signed)
Dr. Rhunette Croft at bedside inserting central line at this time.

## 2020-08-26 NOTE — ED Notes (Signed)
PATIENT HAS ART LINE IN RIGHT RADIAL INSERTED BY RT.

## 2020-08-26 NOTE — ED Notes (Signed)
Patient continues to take right arm to face and yank off NRB mask. Patient educated on on the importance of keeping the mask on and keeping his right arm straight for his fluids to continue in.

## 2020-08-26 NOTE — Code Documentation (Signed)
Rn went into pt's room, pt unresponsive to sternal rub, Dr Clydia Llano notified and at bedside,

## 2020-08-26 NOTE — ED Notes (Signed)
Pt unable to sign waiver. Pt is altered

## 2020-08-26 NOTE — ED Notes (Addendum)
Pt noted with 2 colostomies. Drainage is watery yellow with some sediment noted, both are draining. Stomas noted to be beefy red. Ostomies on pt at arrival noted to be cut too large for stoma size and were leaking and not properly attached to pt's skin. Materials notified of ostomy sizes required. Will change them upon arrival to unit. JP drain to left upper abdomen with sutures in place, 100 mls emptied from drain, drainage thin and green-yellow in color. Skin breakdown noted around both stomas upon arrival. Feeding tube noted as well, no sutures in place.

## 2020-08-26 NOTE — ED Provider Notes (Addendum)
[facility] EMERGENCY DEPARTMENT Provider Note   CSN: [id number] Arrival date & time: 2020-09-15  1505     History Chief Complaint  Patient presents with  . Shortness of Breath    Devon Campbell is a 65 y.o. male.  HPI     65 y/o M with hx of DM, CKD comes in with cc of DIB. Mr. Devon Campbell unfortunately was admitted to outside hosp between 12/26 to 3/18. He now resides at a SNF.  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 He undewent ileocolectomy and had complications of perforation, aspiration pneumonia, retroperitoneal abscess, UTI, c-diff. Multiple episodes of sepsis since admission - now he is at a SNF and had oral vanc and aspiration CAP antibiotics complited in late April.  Called the nursing facility -no response, no voicemail even. Called the emergency contact line, phone call goes straight to voicemail.  No voicemail left.   Past Medical History:  Diagnosis Date  . Anemia   . Dysphagia   . Ileostomy present (HCC)   . SBO (small bowel obstruction) [facility])     Patient Active Problem List   Diagnosis Date Noted  . Septic shock (HCC) 09-15-20    Past Surgical History:  Procedure Laterality Date  . APPENDECTOMY         No family history on file.  Social History   Tobacco Use  . Smoking status: Current Every Day Smoker  Substance Use Topics  . Alcohol use: Yes    Home Medications Prior to Admission medications   Medication Sig Start Date End Date Taking? Authorizing Provider  clotrimazole-betamethasone (LOTRISONE) cream Apply topically 2 (two) times daily. 07/04/20 07/04/21 Yes [provider]  diphenoxylate-atropine (LOMOTIL) 2.5-0.025 MG/5ML liquid 5 mLs 4 (four) times daily. 08/11/20  Yes [provider]  DULoxetine (CYMBALTA) 30 MG capsule Take by mouth. 08/11/20  Yes [provider]  ferrous sulfate 325 (65 FE) MG tablet Take by mouth. 08/11/20  Yes [provider]  First-Lansoprazole 3 MG/ML  SUSP Give by tube. 08/11/20  Yes [provider]  Loperamide HCl 1 MG/7.5ML LIQD Take by mouth. 08/11/20  Yes [provider]  melatonin 3 MG TABS tablet one tablet (3 mg dose) by Per G Tube route at bedtime as needed (insomnia). 08/11/20  Yes [provider]  vancomycin (VANCOCIN) 125 MG capsule Give by tube. 08/11/20  Yes [provider]  hydrALAZINE (APRESOLINE) 100 MG tablet Take 100 mg by mouth 2 (two) times daily. 04/01/20   [provider]  megestrol (MEGACE) 20 MG tablet Take 20 mg by mouth daily. 04/01/20   [provider]  metoCLOPramide (REGLAN) 10 MG tablet Take 10 mg by mouth 4 (four) times daily. 03/18/20   [provider]  ondansetron (ZOFRAN) 8 MG tablet Take 8 mg by mouth every 8 (eight) hours as needed. 04/11/20   [provider]  OSCIMIN 0.125 MG SUBL Place 1-2 tablets under the tongue every 4 (four) hours as needed. 03/28/20   [provider]  oxyCODONE-acetaminophen (PERCOCET/ROXICET) 5-325 MG tablet Take 1 tablet by mouth every 6 (six) hours as needed. 04/11/20   [provider]  OXYGEN Inhale 2 L into the lungs.    [provider]  XIFAXAN 550 MG TABS tablet Take 550 mg by mouth 3 (three) times daily. 04/11/20   [provider]    Allergies    Tramadol  Review of Systems   Review of Systems  Unable to perform ROS:  Mental status change    Physical Exam Updated Vital Signs BP (!) 75/59   Pulse (!) 101   Temp (!) 96.8 F (36 C) (Bladder)   Resp 18   Ht 6\' 2"  (1.88 m)   Wt 72.6 kg   SpO2 97%   BMI 20.55 kg/m   Physical Exam Vitals and nursing note reviewed.  Constitutional:      General: He is in acute distress.     Appearance: He is ill-appearing.  HENT:     Head: Atraumatic.  Cardiovascular:     Rate and Rhythm: Normal rate.  Pulmonary:     Effort: Tachypnea present.     Breath sounds: Examination of the right-upper field reveals rhonchi.  Examination of the left-upper field reveals rhonchi. Examination of the right-middle field reveals rhonchi. Examination of the left-middle field reveals rhonchi. Examination of the right-lower field reveals rhonchi. Examination of the left-lower field reveals rhonchi. Rhonchi present.  Abdominal:     Palpations: Abdomen is soft.  Musculoskeletal:     Cervical back: Neck supple.     Right lower leg: No edema.     Left lower leg: No edema.  Skin:    General: Skin is warm.  Neurological:     Mental Status: He is disoriented.     ED Results / Procedures / Treatments   Labs (all labs ordered are listed, but only abnormal results are displayed) Labs Reviewed  LACTIC ACID, PLASMA - Abnormal; Notable for the following components:      Result Value   Lactic Acid, Venous 3.7 (*)    All other components within normal limits  COMPREHENSIVE METABOLIC PANEL - Abnormal; Notable for the following components:   Glucose, Bld 138 (*)    BUN 80 (*)    Creatinine, Ser 2.40 (*)    Calcium 10.4 (*)    Total Protein 8.3 (*)    Albumin 2.4 (*)    Alkaline Phosphatase 186 (*)    GFR, Estimated 29 (*)    All other components within normal limits  CBC WITH DIFFERENTIAL/PLATELET - Abnormal; Notable for the following components:   WBC 23.5 (*)    MCHC 29.2 (*)    RDW 17.5 (*)    Platelets 509 (*)    Neutro Abs 20.3 (*)    Basophils Absolute 0.2 (*)    Abs Immature Granulocytes 0.85 (*)    All other components within normal limits  PROTIME-INR - Abnormal; Notable for the following components:   Prothrombin Time 15.4 (*)    All other components within normal limits  BLOOD GAS, ARTERIAL - Abnormal; Notable for the following components:   pH, Arterial 7.226 (*)    pCO2 arterial 58.0 (*)    pO2, Arterial 136 (*)    Acid-base deficit 3.3 (*)    All other components within normal limits  BLOOD GAS, ARTERIAL - Abnormal; Notable for the following components:   pH, Arterial 7.176 (*)    pCO2 arterial  58.6 (*)    pO2, Arterial 76.5 (*)    Bicarbonate 18.3 (*)    Acid-base deficit 6.2 (*)    All other components within normal limits  RESP PANEL BY RT-PCR (FLU A&B, COVID) ARPGX2  CULTURE, BLOOD (ROUTINE X 2)  CULTURE, BLOOD (ROUTINE X 2)  URINE CULTURE  APTT  LACTIC ACID, PLASMA  URINALYSIS, ROUTINE W REFLEX MICROSCOPIC    EKG EKG Interpretation  Date/Time:  Tuesday 09-02-2020 15:49:53 EDT Ventricular Rate:  124 PR Interval:  129 QRS Duration: 84 QT Interval:  294 QTC Calculation: 423 R Axis:   60 Text Interpretation: Sinus tachycardia Borderline T abnormalities, diffuse leads No acute changes No significant change since last tracing Confirmed by Derwood Kaplan [phone]) on 09/10/2020 7:19:07 PM   Radiology DG Chest Portable 1 View  Result Date: 09-10-20 CLINICAL DATA:  Central line placement. EXAM: PORTABLE CHEST 1 VIEW COMPARISON:  09/10/2020, earlier the same day FINDINGS: 1746 hours. Extreme right lung apex not included on the film. Endotracheal tube tip is 3.1 cm above the base of the carina. The NG tube passes into the stomach although the distal tip position is not included on the film. Right IJ central line is new in the interval with the tip overlying the mid SVC level. No gross right-sided pneumothorax, but, again, the right apex is not been included on the film. Interstitial markings are diffusely coarsened with chronic features. Increasing airspace disease at the right base may be atelectasis although aspiration not excluded. IMPRESSION: 1. Support apparatus as above. 2. No evidence for right-sided pneumothorax although extreme right lung apex is not been included on the film. 3. Increasing airspace disease at the right base may be atelectatic although aspiration not excluded. Electronically Signed   By: Kennith Center M.D.   On: Sep 10, 2020 18:11   DG Chest Port 1 View  Result Date: 2020/09/10 CLINICAL DATA:  Possible sepsis EXAM: PORTABLE CHEST 1 VIEW COMPARISON:   06/29/2010 FINDINGS: Cardiac shadow is within normal limits. Aortic calcifications are seen. Lungs are well aerated bilaterally with mild right basilar atelectasis. Gastrostomy catheter is noted place. No bony abnormality is seen. Ureteral stents are noted bilaterally but incompletely evaluated. IMPRESSION: Mild right basilar atelectasis. No other focal abnormality is noted. Electronically Signed   By: Alcide Clever M.D.   On: 2020-09-10 16:19    Procedures .Critical Care Performed by: Derwood Kaplan, MD Authorized by: Derwood Kaplan, MD   Critical care provider statement:    Critical care time (minutes):  165   Critical care was necessary to treat or prevent imminent or life-threatening deterioration of the following conditions:  Shock, sepsis, circulatory failure, CNS failure or compromise and renal failure   Critical care was time spent personally by me on the following activities:  Discussions with consultants, evaluation of patient's response to treatment, examination of patient, ordering and performing treatments and interventions, ordering and review of laboratory studies, ordering and review of radiographic studies, pulse oximetry, re-evaluation of patient's condition, obtaining history from patient or surrogate and review of old charts Procedure Name: Intubation Date/Time: Sep 10, 2020 6:57 PM Performed by: Derwood Kaplan, MD Pre-anesthesia Checklist: Patient identified, Patient being monitored, Emergency Drugs available, Timeout performed and Suction available Oxygen Delivery Method: Non-rebreather mask Preoxygenation: Pre-oxygenation with 100% oxygen Induction Type: Rapid sequence Ventilation: Mask ventilation without difficulty Laryngoscope Size: 4 and Glidescope Grade View: Grade II Tube size: 7.5 mm Number of attempts: 1 Placement Confirmation: ETT inserted through vocal cords under direct vision,  CO2 detector and Breath sounds checked- equal and bilateral Secured at: 26  cm Tube secured with: ETT holder    NG placement  Date/Time: 09-10-2020 6:58 PM Performed by: Derwood Kaplan, MD Authorized by: Derwood Kaplan, MD  Consent: The procedure was performed in an emergent situation. Patient identity confirmed: arm band Time out: Immediately prior to procedure a "time out" was called to verify the correct patient, procedure, equipment, support staff and site/side marked as required. Local anesthesia used: no  Anesthesia: Local anesthesia used: no Patient tolerance:  patient tolerated the procedure well with no immediate complications  .Central Line  Date/Time: 08/22/2020 6:58 PM Performed by: Derwood KaplanNanavati, Casee Knepp, MD Authorized by: Derwood KaplanNanavati, Dallis Czaja, MD   Consent:    Consent obtained:  Emergent situation Universal protocol:    Patient identity confirmed:  Arm band Pre-procedure details:    Indication(s): central venous access and insufficient peripheral access     Hand hygiene: Hand hygiene performed prior to insertion     Sterile barrier technique: All elements of maximal sterile technique followed     Skin preparation:  Chlorhexidine   Skin preparation agent: Skin preparation agent completely dried prior to procedure   Anesthesia:    Anesthesia method:  Local infiltration   Local anesthetic:  Lidocaine 1% WITH epi Procedure details:    Location:  R internal jugular   Patient position:  Trendelenburg   Procedural supplies:  Triple lumen   Ultrasound guidance: yes     Ultrasound guidance timing: real time     Sterile ultrasound techniques: Sterile gel and sterile probe covers were used     Number of attempts:  1   Successful placement: yes   Post-procedure details:    Post-procedure:  Dressing applied and line sutured   Assessment:  Blood return through all ports, free fluid flow, no pneumothorax on x-ray and placement verified by x-ray   Procedure completion:  Tolerated     Medications Ordered in ED Medications  lactated ringers infusion  (has no administration in time range)  fentaNYL (SUBLIMAZE) injection 50 mcg (0 mcg Intravenous Hold 08/24/2020 1845)  fentaNYL 2500mcg in NS 250mL (4310mcg/ml) infusion-PREMIX (has no administration in time range)  fentaNYL (SUBLIMAZE) bolus via infusion 25 mcg (has no administration in time range)  midazolam (VERSED) injection 1 mg (has no administration in time range)  midazolam (VERSED) injection 1 mg (has no administration in time range)  vancomycin (VANCOREADY) IVPB 1500 mg/300 mL (1,500 mg Intravenous New Bag/Given 09/12/2020 1853)    Followed by  vancomycin (VANCOCIN) IVPB 1000 mg/200 mL premix (has no administration in time range)  ceFEPIme (MAXIPIME) 2 g in sodium chloride 0.9 % 100 mL IVPB (has no administration in time range)  0.9 %  sodium chloride infusion (has no administration in time range)  0.9 %  sodium chloride infusion (has no administration in time range)  norepinephrine (LEVOPHED) 4mg  in 250mL premix infusion (25 mcg/min Intravenous Rate/Dose Change 09/07/2020 1925)  vasopressin (PITRESSIN) 20 Units in sodium chloride 0.9 % 100 mL infusion-*FOR SHOCK* (has no administration in time range)  lactated ringers bolus 1,000 mL (has no administration in time range)  lactated ringers bolus 2,000 mL (0 mLs Intravenous Stopped 09/12/2020 1844)  ceFEPIme (MAXIPIME) 2 g in sodium chloride 0.9 % 100 mL IVPB (0 g Intravenous Stopped 09/07/2020 1613)  azithromycin (ZITHROMAX) 500 mg in sodium chloride 0.9 % 250 mL IVPB (0 mg Intravenous Stopped 08/20/2020 1844)  PHENYLephrine 40 mcg/ml in normal saline Adult IV Push Syringe (For Blood Pressure Support) (100 mcg Intravenous Given 09/10/2020 1651)  etomidate (AMIDATE) injection (20 mg Intravenous Given 09/14/2020 1650)  rocuronium (ZEMURON) injection (100 mg Intravenous Given 08/23/2020 1650)  sodium chloride 0.9 % bolus 1,000 mL (1,000 mLs Intravenous New Bag/Given 09/08/2020 1853)    ED Course  I have reviewed the triage vital signs and the nursing  notes.  Pertinent labs & imaging results that were available during my care of the patient were reviewed by me and considered in my medical decision making (see chart for details).  Clinical Course as of 09/04/2020 1954  Tue Aug 26, 2020  1700 I was made aware by the nursing staff that patient is not breathing well and that he is hypotensive. I reviewed the ABG and patient had hypercapnia with respiratory acidosis, BiPAP was already ordered.  I still did not have any information from family about goals of care, besides EMS telling us that patient is full code.  We proceeded with intubation.  Phenylephrine was given peri-intubation. [AN]  1830 Talked to one of the emergency contacts, patient's brother-in-law around 1800.  He confirms patient is full code.  We discussed the grave prognosis with the current condition and the significant medical burden he is already gone through the last few days.  He is asked me to reach out again close to 7 PM.  Central line placed. [AN]  1903 Respiratory team has placed arterial line.  Continues to be hypotensive.  Stress dose steroid ordered, vasopressin ordered.  Will assess to see if he needs a second pressor.  3 L of IV fluid bolus ordered, will likely need more. [AN]  1903 Sepsis hemodynamic reassessment done. [AN]  1951 Had another family conversation on goals of care.  For now, CODE STATUS is DNR. Family has agreed to continue with aggressive supportive measures (pressors, ventilation) overnight, and for the ICU team to discuss with the family patient's progress or lack of it.  If patient is worsening, they will likely proceed to comfort care.  Family is also designated Clois Comber [phone] as the contact person.  She is patient's niece.   Over the phone were patient's brother, sister and brother-in-law. [AN]  1953 ICU team excepting to [facility]  Patient's BP has remained 90s over 60s since 7 PM.  He is on norepinephrine at 24 mics per minute and  vasopressin is pending.  I have canceled the hydrocortisone. [AN]    Clinical Course User Index [AN] Derwood Kaplan, MD   MDM Rules/Calculators/A&P                          Mr Branham is a diabetic male with hx of CKD who has undergone surgery for SBO with subsequent complications of abscesses/perfs and aspiration pneumonia, cdiff. He is coming from SNF with + qSOFA (confusion and elevated RR).  He is in acute hypoxic resp failure.   Concerns for sepsis - will start CAP coverage. Likely source resp - but will also have to assess the abd closely given that he had perc tube and   Final Clinical Impression(s) / ED Diagnoses Final diagnoses:  Septic shock [facility])    Rx / DC Orders ED Discharge Orders    None       Derwood Kaplan, MD 08/17/2020 Rushie Goltz    Derwood Kaplan, MD 09/11/2020 1954

## 2020-08-26 NOTE — Procedures (Signed)
Arterial Catheter Insertion Procedure Note  Devon Campbell  [id number]  April 23, 1955  Date:09/01/2020  Time:6:53 PM    Provider Performing: Lucia Gaskins    Procedure: Insertion of Arterial Line (21975)   Indication(s) Blood pressure monitoring and/or need for frequent ABGs  Consent   Anesthesia none   Time Out Verified patient identification, verified procedure, site/side was marked, verified correct patient position, special equipment/implants available, medications/allergies/relevant history reviewed, required imaging and test results available.   Sterile Technique performed   Procedure Description Area of catheter insertion was cleaned with chlorhexidine and draped in sterile fashion. without  real-time ultrasound guidance an arterial catheter was placed into the right radial artery.  Appropriate arterial tracings confirmed on monitor.     Complications/Tolerance No complications noted.    EBL No blood loss   Specimen(s) None  BP reading 58/33.

## 2020-08-26 NOTE — Sepsis Progress Note (Signed)
Notified bedside nurse of need to draw repeat lactic acid. 

## 2020-08-26 NOTE — H&P (Addendum)
NAME:  Devon Campbell, MRN:  [id number], DOB:  1955-08-05, LOS: 0 ADMISSION DATE:  08/31/2020, CONSULTATION DATE:  09/16/2020 REFERRING MD:  Rhunette Croft CHIEF COMPLAINT:  Septic Shock   History of Present Illness:  Devon Campbell is a 65 y.o. male who presented to [facility] ED 5/10 from [facility] with SOB, AMS, hypotension, hypoxia.  He had been admitted to [facility] from 12/26 through 3/18 for SBO with SB fistula.  He underwent ileocolectomy which was complicated by perforation, aspiration PNA, RP abscess, UTI, C.diff.  Since then, he has had multiple episodes of sepsis and admission to [facility] before being discharged back to SNF.  At [facility] ED, he was found to have septic shock and respiratory failure.  He was subsequently intubated and had central and arterial lines placed before being started on vasopressors.  Family was contacted by Dr. Rhunette Croft and per report, decision was made to list code status as DNR; however, continue all current measures in hopes that we can see some improvement over the next 24 hours. Family point of contact is Devon Campbell [phone]).  He was later transferred to [facility] for further management.  Of note, per nursing notes while at [facility], pt has 2 colostomies with the wrong size pouches in place.  JP drain to LUQ also noted with sutures in place.  Both ostomies and JP draining watery green to yellow fluid.  3rd pouch also noted that was placed over a dehisced area of a surgical wound.  Surgical incision noted from sternum to pubis with wound dehiscence at the distal portion of wound.  See significant hospital events section for a summary of sig events from previous hospitalization.   Pertinent  Medical History:  Per chart review:  Prostate CA with recent pelvic radiation, CKD, DM2, SBO, SB fistula.   Significant Hospital Events: Including procedures, antibiotic start and stop dates in addition to other pertinent events   Per chart review, some significant events from previous  hospitalization are outlined below: 12/26 > admitted to [facility]. 12/31 > ex lap with ileocolectomy and LOA. 1/6 > back to OR for abdominal washout, perforation noted at prior colectomy site > end ostomy created. 1/29 > incidentally tested positive for COVID (though remained asymptomatic). Mid feb > aspiration PNA, transferred to ICU, sputum culture + pseudomonas. Early March > recurrent aspiration PNA, abscess in right psoas and retroperitoneum, abx broadened to Cefiderocol, Flagyl, Lizeolid, Eraxis. 3/18 > discharged to SNF. 3/24 > re-admitted to [facility] with sepsis from PNA and UTI, Rx with 7 days abx and C.diff prophylaxis (PO vanc) 4/12 > urine culture grew enterobacter cloacae Unknown when discharged back to SNF ----------------- 5/10 > presented to [facility], intubated and later transferred to [facility]   Micro Data: Blood 5/10 >  Sputum 5/10 > Urine 5/10 >  Flu 5/10 > neg. SARS CoV2 5/10 > neg.  Antibiotics: Vanc 5/10 >  Cefepime 5/10 >   Interim History / Subjective:  Intubated.  Not responsive despite no sedation. Refractory shock despite 40 levo and 0.03 vaso. Getting fluids now as bedside POCUS showed underfilling.  Objective:  Blood pressure 94/61, pulse (!) 101, temperature (!) 96.44 F (35.8 C), temperature source Bladder, resp. rate 18, height 6\' 2"  (1.88 m), weight 72.6 kg, SpO2 96 %. CVP:  [16 mmHg-20 mmHg] 16 mmHg  Vent Mode: PRVC FiO2 (%):  [60 %-100 %] 60 % Set Rate:  [18 bmp] 18 bmp Vt Set:  [650 mL] 650 mL PEEP:  [5 cmH20] 5 cmH20  Plateau Pressure:  [30 cmH20] 30 cmH20   Intake/Output Summary (Last 24 hours) at 10-02-2020 2021 Last data filed at 10-02-2020 1846 Gross per 24 hour  Intake 2390 ml  Output --  Net 2390 ml   Filed Weights   May 13, 2020 1517 May 13, 2020 1531 May 13, 2020 1622  Weight: 104.3 kg 72.6 kg 72.6 kg    Examination: General: Adult male, chronically ill appearing, cachectic, critically ill. Neuro: Unresponsive on vent. HEENT: [place]/AT. Sclerae  anicteric. ETT in place. Cardiovascular: RRR, no M/R/G.  Lungs: Respirations even and unlabored.  Coarse bilaterally. Abdomen: 2 ostomies with green/yellow fluid.  Midline abdominal incision noted with wound dehiscence in suprapubic area.  See image below.  BS hypoactive. Musculoskeletal: Muscle wasting, no edema.  Skin: Intact, warm, no rashes.     Labs/imaging personally reviewed:  CT head 5/10 >  CT abd / pelv 5/10 >   Assessment & Plan:   Septic shock - unclear source but likely GI given extensive GI history, concern for wound infection as has hx of sacral wounds, now also with dehisced abdominal incision (see photo above).  Also possibly underlying PNA. - Continue Levophed as needed for goal MAP > 65. - Continue Vanc / Cefepime. - Follow cultures. - CT abd / pelv.  Acute hypoxic respiratory failure - s/p intubation. - Full vent support. - SBT when mental status allows. - Bronchial hygiene. - Follow CXR.  Acute encephalopathy - unclear etiology, ? 2/2 shock/hypoperfusion. - CT head once stabilizes.  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 - Monitor output, if high then would resume Lomotil and tincture of opium as he was previously receiving on prior admission at OSH. - Wound care consult.  Dysphagia - failed multiple swallow evals. Severe protein calorie malnutrition. - Continue TF's via PEG.  Hx bilateral hydronephrosis w/p b/l ureteral stents, last exchanged 07/18/20 (with plans to exchange again 3 - 4 week after, unsure whether this was done). - Will need urology input regarding stent exchange.  AoCKD. - Supportive care.  Iron deficiency anemia. - Continue iron supplementation.  Stage 2 gluteal pressure wounds, present PTA. - Wound care consult.   Best practice (evaluated daily):  Diet:  NPO Pain/Anxiety/Delirium protocol (if indicated): Yes (RASS goal -1) VAP protocol (if indicated): Yes DVT prophylaxis: Subcutaneous  Heparin GI prophylaxis: PPI Glucose control:  SSI No Central venous access:  Yes, and it is still needed Arterial line:  Yes, and it is still needed Foley:  Yes, and it is still needed Mobility:  bed rest  PT consulted: N/A Last date of multidisciplinary goals of care discussion: None yet. Code Status:  DNR.  Discussed with family point of contact, Devon Campbell and updated her on pt's current condition.  She is calling additional family now and has requested that I call her back in the next 30 minutes to discuss next steps. Disposition: ICU  Labs   CBC: Recent Labs  Lab May 13, 2020 1542  WBC 23.5*  NEUTROABS 20.3*  HGB 13.0  HCT 44.5  MCV 90.6  PLT 509*    Basic Metabolic Panel: Recent Labs  Lab May 13, 2020 1542  NA 135  K 3.7  CL 98  CO2 24  GLUCOSE 138*  BUN 80*  CREATININE 2.40*  CALCIUM 10.4*   GFR: Estimated Creatinine Clearance: 31.1 mL/min (A) (by C-G formula based on SCr of 2.4 mg/dL (H)). Recent Labs  Lab May 13, 2020 1542 May 13, 2020 1922  WBC 23.5*  --   LATICACIDVEN 3.7* 4.0*    Liver Function  Tests: Recent Labs  Lab 08/18/2020 1542  AST 23  ALT 17  ALKPHOS 186*  BILITOT 0.6  PROT 8.3*  ALBUMIN 2.4*   No results for input(s): LIPASE, AMYLASE in the last 168 hours. No results for input(s): AMMONIA in the last 168 hours.  ABG    Component Value Date/Time   PHART 7.176 (LL) 08/19/2020 1922   PCO2ART 58.6 (H) 08/20/2020 1922   PO2ART 76.5 (L) 08/25/2020 1922   HCO3 18.3 (L) 09/01/2020 1922   ACIDBASEDEF 6.2 (H) 09/05/2020 1922   O2SAT 93.2 08/21/2020 1922     Coagulation Profile: Recent Labs  Lab 09/08/2020 1542  INR 1.2    Cardiac Enzymes: No results for input(s): CKTOTAL, CKMB, CKMBINDEX, TROPONINI in the last 168 hours.  HbA1C: No results found for: HGBA1C  CBG: No results for input(s): GLUCAP in the last 168 hours.  Review of Systems:   Unable to obtain as pt is encephalopathic.  Past Medical History:  He,  has a past medical history of  Anemia, Dysphagia, Ileostomy present (HCC), and SBO (small bowel obstruction) (HCC).   Surgical History:   Past Surgical History:  Procedure Laterality Date  . APPENDECTOMY       Social History:   reports that he has been smoking. He does not have any smokeless tobacco history on file. He reports current alcohol use.   Family History:  His family history is not on file.   Allergies Allergies  Allergen Reactions  . Tramadol      Home Medications  Prior to Admission medications   Medication Sig Start Date End Date Taking? Authorizing Provider  acetaminophen (TYLENOL) 160 MG/5ML suspension Take 649.6 mg by mouth every 6 (six) hours as needed for moderate pain.   Yes [provider]  diphenoxylate-atropine (LOMOTIL) 2.5-0.025 MG/5ML liquid Place 5 mLs into feeding tube 4 (four) times daily. 08/11/20  Yes [provider]  DULoxetine (CYMBALTA) 30 MG capsule 30 mg daily. 08/11/20  Yes [provider]  Emollient (EUCERIN) lotion Apply 1 mL topically every 12 (twelve) hours as needed for dry skin.   Yes [provider]  ferrous sulfate 325 (65 FE) MG tablet Take 325 mg by mouth daily with breakfast. 08/11/20  Yes [provider]  First-Lansoprazole 3 MG/ML SUSP Give 30 mg by tube in the morning. 08/11/20  Yes [provider]  guaiFENesin (MUCINEX CHILDRENS PO) Place 400 mg into feeding tube 2 (two) times daily. For 14 days   Yes [provider]  insulin lispro (HUMALOG) 100 UNIT/ML injection Inject 2-12 Units into the skin See admin instructions. Inject as per sliding scale if 0-150=0 units; 151-200=2 units;201-250=4 units;251-300=6 units; 301-350=8 units; 351-400=10 units; 401+=12 units 401 or above : call MD.   Yes [provider]  levofloxacin (LEVAQUIN) 750 MG tablet Take 750 mg by mouth daily.   Yes [provider]  Loperamide HCl 1 MG/7.5ML LIQD Place 30 mLs into feeding tube in the morning and at bedtime.  08/11/20  Yes [provider]  melatonin 3 MG TABS tablet Place 3 mg into feeding tube at bedtime. 08/11/20  Yes [provider]  multivitamin with minerals (CERTA-VITE) LIQD Place 15 mLs into feeding tube daily.   Yes [provider]  ondansetron (ZOFRAN) 8 MG tablet Take 8 mg by mouth every 8 (eight) hours as needed. 04/11/20  Yes [provider]  OXYCODONE HCL PO Place 5 mg into feeding tube every 8 (eight) hours as needed (pain).  Yes [provider]  sodium chloride 1 g tablet Place 2 g into feeding tube 2 (two) times daily.   Yes [provider]  vitamin C (ASCORBIC ACID) 500 MG tablet Place 500 mg into feeding tube 2 (two) times daily.   Yes [provider]  zinc sulfate 220 (50 Zn) MG capsule Place 220 mg into feeding tube in the morning.   Yes [provider]  Amino Acids-Protein Hydrolys (FEEDING SUPPLEMENT, PRO-STAT 64,) LIQD Place 30 mLs into feeding tube in the morning.    [provider]  clotrimazole-betamethasone (LOTRISONE) cream Apply topically 2 (two) times daily. Patient not taking: No sig reported 07/04/20 07/04/21  [provider]  hydrALAZINE (APRESOLINE) 100 MG tablet Take 100 mg by mouth 2 (two) times daily. Patient not taking: No sig reported 04/01/20   [provider]  megestrol (MEGACE) 20 MG tablet Take 20 mg by mouth daily. Patient not taking: No sig reported 04/01/20   [provider]  metoCLOPramide (REGLAN) 10 MG tablet Take 10 mg by mouth 4 (four) times daily. Patient not taking: No sig reported 03/18/20   [provider]  OSCIMIN 0.125 MG SUBL Place 1-2 tablets under the tongue every 4 (four) hours as needed. Patient not taking: No sig reported 03/28/20   [provider]  oxyCODONE-acetaminophen (PERCOCET/ROXICET) 5-325 MG tablet Take 1 tablet by mouth every 6 (six) hours as needed. Patient not taking: No sig reported 04/11/20   [provider]  OXYGEN Inhale 2 L into the lungs.    [provider]  vancomycin (VANCOCIN) 125 MG capsule Give by tube. Patient not taking: No sig reported 08/11/20   [provider]  XIFAXAN 550 MG TABS tablet Take 550 mg by mouth 3 (three) times daily. Patient not taking: No sig reported 04/11/20   [provider]     Critical care time: 70 minutes.    Rutherford Guys, PA - C [place] [facility] For pager details, please see AMION or use Epic chat  After 1900, please call [facility] for cross coverage needs 08/18/2020, 11:26 PM

## 2020-08-26 NOTE — Sepsis Progress Note (Signed)
Code sepsis protocol being monitored by eLink. 

## 2020-08-26 NOTE — ED Notes (Signed)
Temp foley placed.

## 2020-08-26 NOTE — ED Triage Notes (Signed)
From [facility]. EMS called out for sob. Pt was altered for unknown time.  Hypotensive, oxygen at 80% on 5LNC.    EMS got oxygen at 91% from [facility]

## 2020-08-26 NOTE — ED Notes (Addendum)
Temp foley placed and stoma sites cleaned with new ostomy pouches placed. Pt originally had 3 ostomy pouches in place but only 2 stomas. 3rd pouch was placed over dehisced area of surgical wound that was discover upon removal of pouches. Surgical incision extends from sternum to above pubis, and dehiscing at the distal portion of the wound. Surrounding area cleaned and provider called to room for assessment. Surrounding skin red and irritated. Dry dressing placed at this time.

## 2020-08-26 NOTE — Progress Notes (Signed)
Pharmacy Antibiotic Note  Devon Campbell is a 65 y.o. male admitted on 09/12/2020 with pneumonia.  Pharmacy has been consulted for Vancomycin and cefepime dosing.  Plan: Vancomycin 1500mg  loading dose, then 1000mg  IV every 24 hours.  Goal trough 15-20 mcg/mL. Expected AUC 468 Cefepime 2gm IV q12h F/U cxs and clinical progress Monitor V/S, labs and levels as indicated  Height: 6\' 2"  (188 cm) Weight: 72.6 kg (160 lb 0.9 oz) IBW/kg (Calculated) : 82.2  Temp (24hrs), Avg:98.8 F (37.1 C), Min:98.8 F (37.1 C), Max:98.8 F (37.1 C)  Recent Labs  Lab 09/05/2020 1542  WBC 23.5*  CREATININE 2.40*  LATICACIDVEN 3.7*    Estimated Creatinine Clearance: 31.1 mL/min (A) (by C-G formula based on SCr of 2.4 mg/dL (H)).    Allergies  Allergen Reactions  . Tramadol     Antimicrobials this admission: Vancomycin 5/10>>  Cefepime 5/10 >> Azithromycin 5/10   Microbiology results: 5/10 BCx: pending 5/10 UCx: pending  MRSA PCR:  Thank you for allowing pharmacy to be a part of this patient's care.  7/10, BS 7/10, 7/10 Clinical Pharmacist Pager [phone] 08/26/2020 4:56 PM

## 2020-08-27 LAB — MRSA PCR SCREENING: MRSA by PCR: POSITIVE — AB

## 2020-08-27 MED ORDER — ACETAMINOPHEN 325 MG PO TABS: 650.0000 mg | ORAL_TABLET | Freq: Four times a day (QID) | ORAL | Status: DC | PRN

## 2020-08-27 MED ORDER — GLYCOPYRROLATE 1 MG PO TABS: 1.0000 mg | ORAL_TABLET | ORAL | Status: DC | PRN

## 2020-08-27 MED ORDER — MORPHINE SULFATE (PF) 2 MG/ML IV SOLN: 2.0000 mg | INTRAVENOUS | Status: DC | PRN

## 2020-08-27 MED ORDER — POLYVINYL ALCOHOL 1.4 % OP SOLN: 1.0000 [drp] | Freq: Four times a day (QID) | OPHTHALMIC | Status: DC | PRN

## 2020-08-27 MED ORDER — GLYCOPYRROLATE 0.2 MG/ML IJ SOLN: 0.2000 mg | INTRAMUSCULAR | Status: DC | PRN

## 2020-08-27 MED ORDER — ACETAMINOPHEN 650 MG RE SUPP: 650.0000 mg | Freq: Four times a day (QID) | RECTAL | Status: DC | PRN

## 2020-08-27 MED ORDER — DIPHENHYDRAMINE HCL 50 MG/ML IJ SOLN: 25.0000 mg | INTRAMUSCULAR | Status: DC | PRN

## 2020-08-27 MED ORDER — LORAZEPAM 2 MG/ML IJ SOLN: 2.0000 mg | INTRAMUSCULAR | Status: DC | PRN

## 2020-08-27 MED ORDER — MORPHINE BOLUS VIA INFUSION
5.0000 mg | INTRAVENOUS | Status: DC | PRN
  Filled 2020-08-27: qty 5

## 2020-08-27 MED ORDER — MORPHINE SULFATE (PF) 2 MG/ML IV SOLN
2.0000 mg | INTRAVENOUS | Status: DC | PRN
  Administered 2020-08-27: 2 mg via INTRAVENOUS
  Filled 2020-08-27: qty 1

## 2020-08-27 MED ORDER — DEXTROSE 5 % IV SOLN: INTRAVENOUS | Status: DC

## 2020-08-27 MED ORDER — MORPHINE 100MG IN NS 100ML (1MG/ML) PREMIX INFUSION: 0.0000 mg/h | INTRAVENOUS | Status: DC

## 2020-08-31 LAB — CULTURE, BLOOD (ROUTINE X 2)
Culture: NO GROWTH
Culture: NO GROWTH

## 2020-09-17 NOTE — Progress Notes (Signed)
Pt TOD 0205. Pronounced by Floy Sabina, RN and Natasha Bence, RN. ELink notified. Support provided to pt's family at bedside.

## 2020-09-17 NOTE — Discharge Summary (Signed)
This is a 65 year old white male who presented from [facility].  The patient's had a prolonged hospitalization secondary to small bowel obstruction and enterocutaneous fistulas.  Ultimately has had elevated colectomy with ostomy formation.  His hospitalization was complicated with retroperitoneal abscess urinary tract infection and C. difficile infection.  He presented back to the emergency room with septic shock.  He required emergent intubation and placed on vasoactive support.  Triple-lumen and arterial line was placed in the emergency room at [facility].    Dx at the time of death: Profound septic shock  Acute respiratory failure  Metabolic encephalopathy  Purulent drainage from JP drain  Small wound dehiscence of the prior surgical site.    Hospital events: Patient was admitted as a transfer from [facility] to the intensive care unit at [facility]. On arrival to the intensive care unit patient condition was critically ill and her condition rapidly declined.  Family was notified of the patient's decline and changed the care plan from aggressive medical therapy to comfort measures only.  Patient died very shortly after initiation of comfort measures only.  Family was at the bedside.  Patient succumbed to his illness approximately 7 hours after arrival to the intensive care unit.

## 2020-09-17 NOTE — Procedures (Signed)
Extubation Procedure Note  Patient Details:   Name: Devon Campbell DOB: [birth year] MRN: [id number]   Extubated patient to comfort care per order at 0140.  RN at bedside.        Phill Myron 08/22/2020, 1:44 AM

## 2020-09-17 NOTE — Progress Notes (Signed)
PCCM Interval Progress Note  Called niece, Inetta Fermo back.  She was able to discuss things with family. Pt's mother is on her way in to visit with pt.  Additional family also possibly coming in for visitation.  Once family has had a chance to say their goodbyes, they will notify RN that they are ready to transition to comfort care.  RN to please call [facility] MD for terminal extubation and comfort care orders.  I have relayed this with [facility] MD, Dr. Warrick Parisian.   Rutherford Guys, PA - C [place] [facility] For pager details, please see AMION or use Epic chat  After 1900, please call EL[facility] for cross coverage needs 09/14/2020, 12:26 AM

## 2020-09-17 NOTE — Discharge Summary (Signed)
Please see history and physical that was completed within 6 hours of the patient's death.  Patient presented to the hospital profound septic shock continued to decline rapidly on admission to the intensive care unit and succumbed to his illness very shortly thereafter.  Family was at bedside.

## 2020-09-17 NOTE — Progress Notes (Signed)
eLink Physician-Brief Progress Note Patient Name: Devon Campbell DOB: 1955/05/01 MRN: [id number]   Date of Service  2020-09-24  HPI/Events of Note  Family is in the room and requesting transition to comfort measures.  eICU Interventions  I camera'd into the room and verified from family in the room that they desired terminal extubation and transition to comfort measures, I explained the process and emphasized that once the tube is removed , the patient's clinical cause is unpredictable , but our role will be to focus on making sure there is no distress or suffering, family verbalized understanding and declined to ask any further questions, CCM withdrawal orders entered.          Devon Campbell 09/24/2020, 1:39 AM

## 2020-09-17 DEATH — deceased

## 2023-01-15 IMAGING — DX DG CHEST 1V PORT
1 series · 1 of 1 positions shown · non-contrast
Comparison: 08/26/2020, earlier the same day

CLINICAL DATA: Central line placement.

EXAM:
PORTABLE CHEST 1 VIEW

[chest ap]
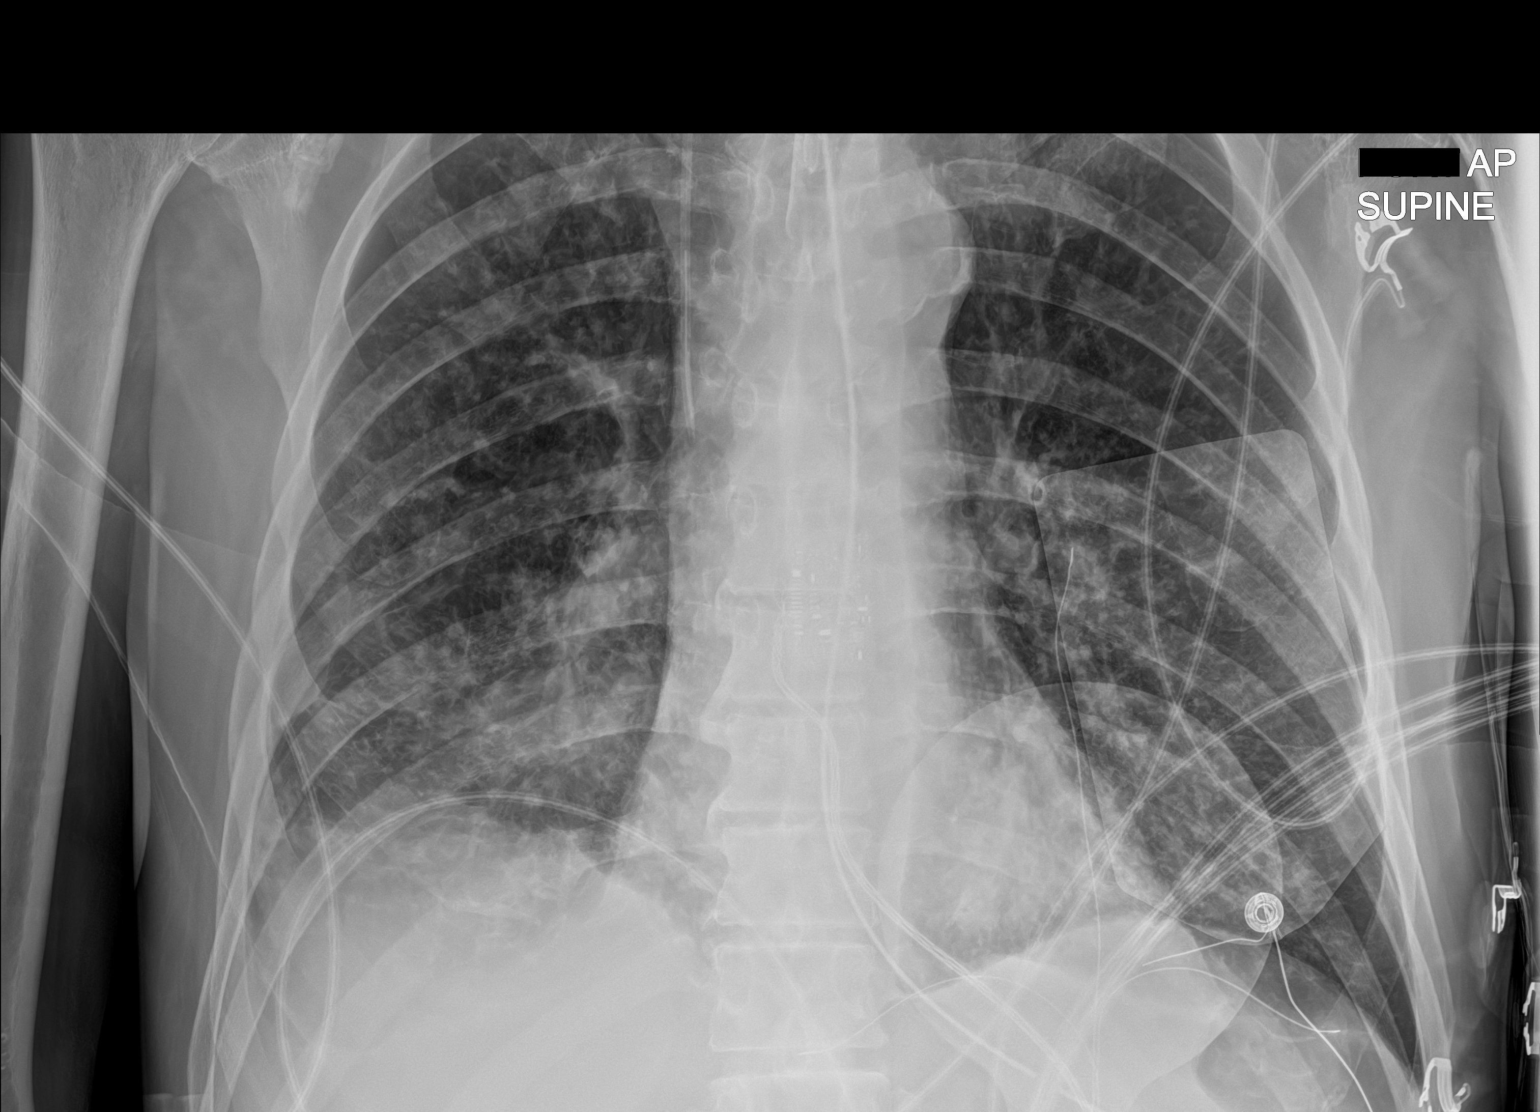

[1 of 1 positions shown; findings below may reference images not displayed]

FINDINGS: 7331 hours. Extreme right lung apex not included on the film.
Endotracheal tube tip is 3.1 cm above the base of the carina. The NG
tube passes into the stomach although the distal tip position is not
included on the film. Right IJ central line is new in the interval
with the tip overlying the mid SVC level. No gross right-sided
pneumothorax, but, again, the right apex is not been included on the
film. Interstitial markings are diffusely coarsened with chronic
features. Increasing airspace disease at the right base may be
atelectasis although aspiration not excluded.
IMPRESSION: 1. Support apparatus as above.
2. No evidence for right-sided pneumothorax although extreme right
lung apex is not been included on the film.
3. Increasing airspace disease at the right base may be atelectatic
although aspiration not excluded.
